# Patient Record
Sex: Female | Born: 2000 | Hispanic: Yes | Marital: Single | Smoking: Never smoker
Health system: Southern US, Community
[De-identification: ages and names within clinical notes are randomized; demographics above are authoritative.]

## PROBLEM LIST (undated history)

## (undated) ENCOUNTER — Ambulatory Visit: Admission: EM | Disposition: A | Discharge status: Home / Self Care | Payer: Self-pay

## (undated) DIAGNOSIS — Z789 Other specified health status: Secondary | ICD-10-CM

## (undated) HISTORY — PX: NO PAST SURGERIES: SHX2092

---
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
## 2021-01-24 DIAGNOSIS — R300 Dysuria: Secondary | ICD-10-CM | POA: Diagnosis not present

## 2021-01-24 DIAGNOSIS — R350 Frequency of micturition: Secondary | ICD-10-CM | POA: Diagnosis not present

## 2021-09-27 IMAGING — US US OB < 14 WEEKS - US OB TV
1 series · 15 of 28 positions shown · non-contrast
Comparison: None.

CLINICAL DATA: Abdominal cramping with vaginal bleeding

EXAM:
OBSTETRIC <14 WK US AND TRANSVAGINAL OB US
TECHNIQUE: Both transabdominal and transvaginal ultrasound examinations were
performed for complete evaluation of the gestation as well as the
maternal uterus, adnexal regions, and pelvic cul-de-sac.
Transvaginal technique was performed to assess early pregnancy.

[Series 1: us ob < 14 weeks - us ob tv · 15 of 45 slices shown]
[im 1/45]
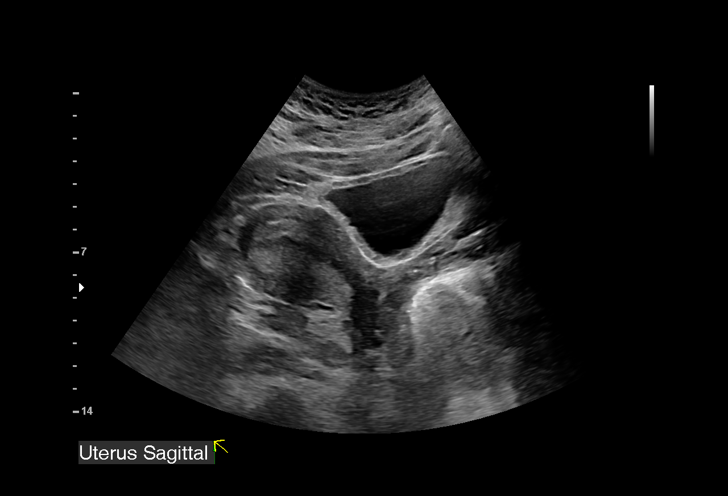
[im 4/45]
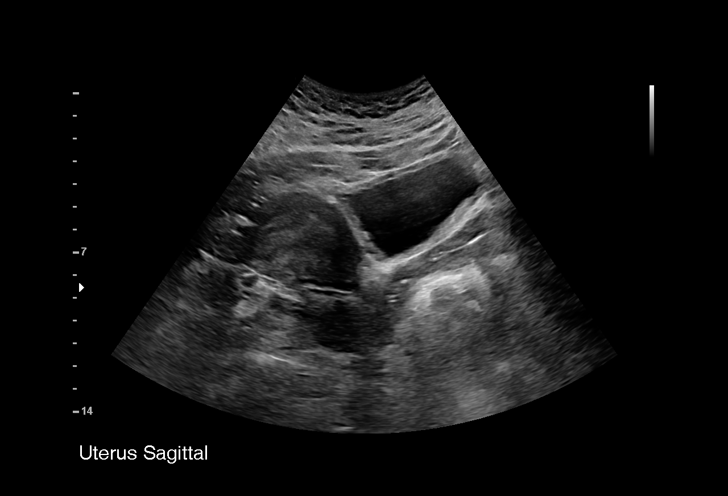
[im 7/45]
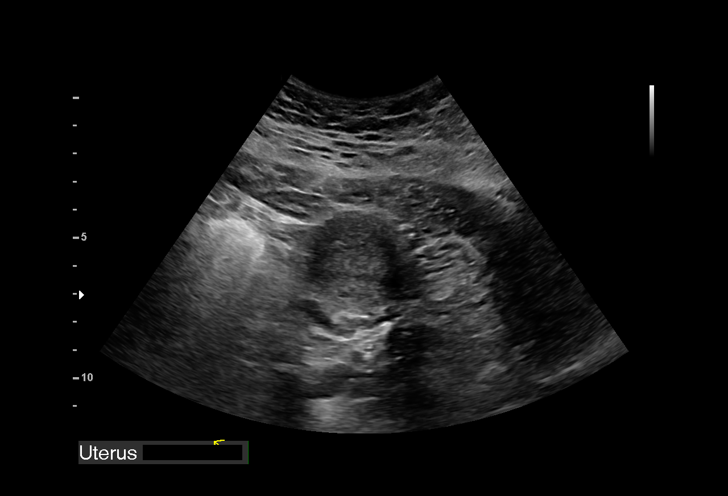
[im 10/45]
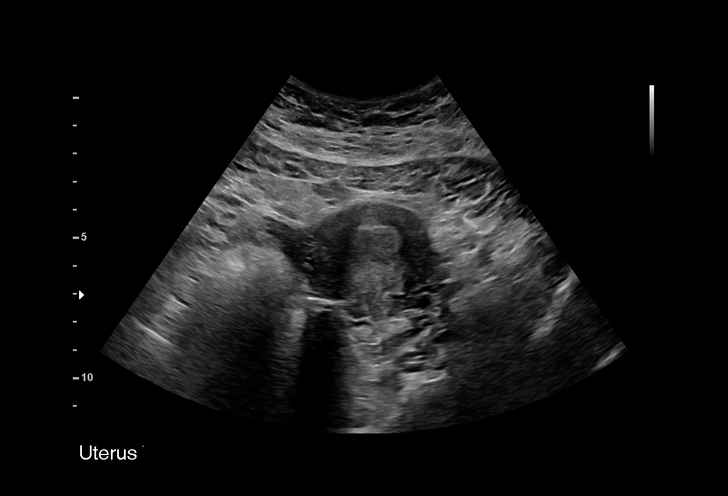
[im 14/45]
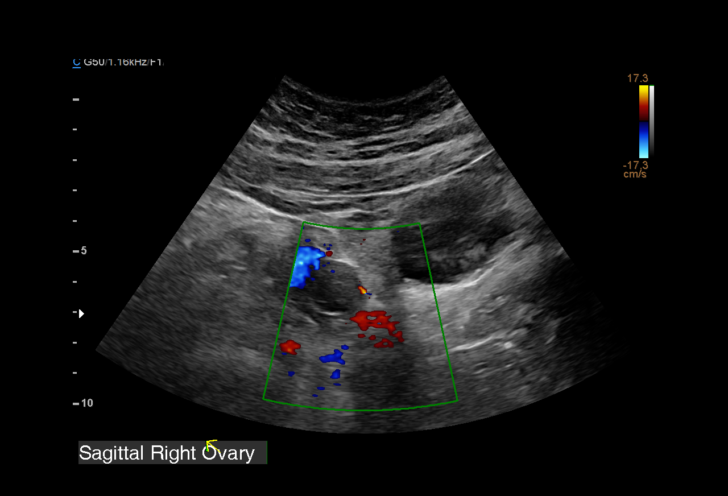
[im 17/45]
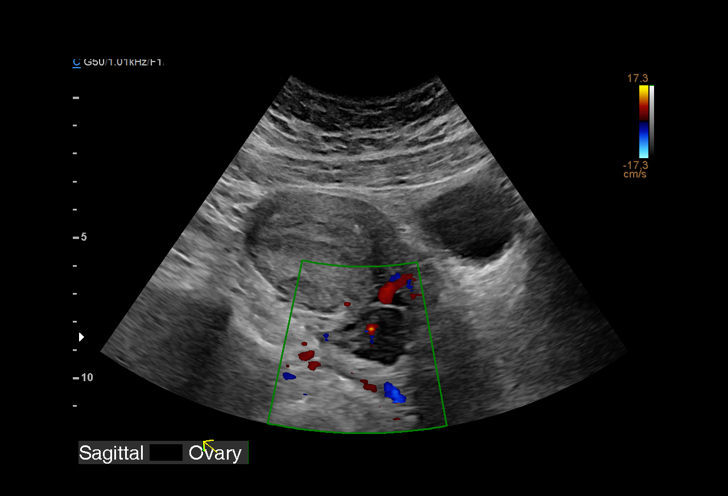
[im 20/45]
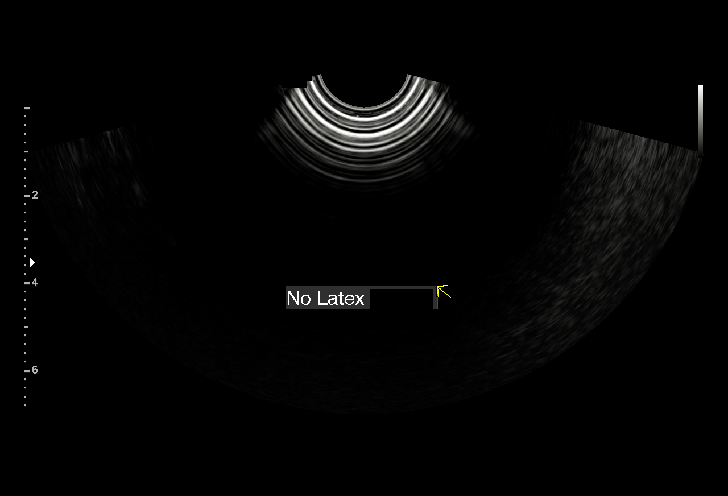
[im 23/45]
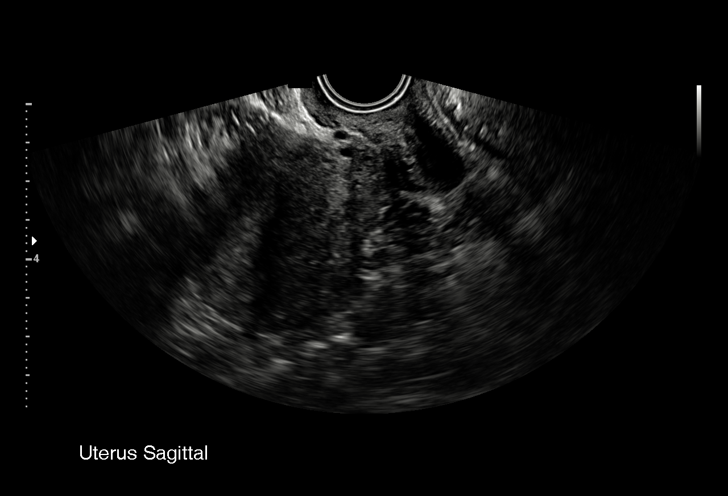
[im 25/45]
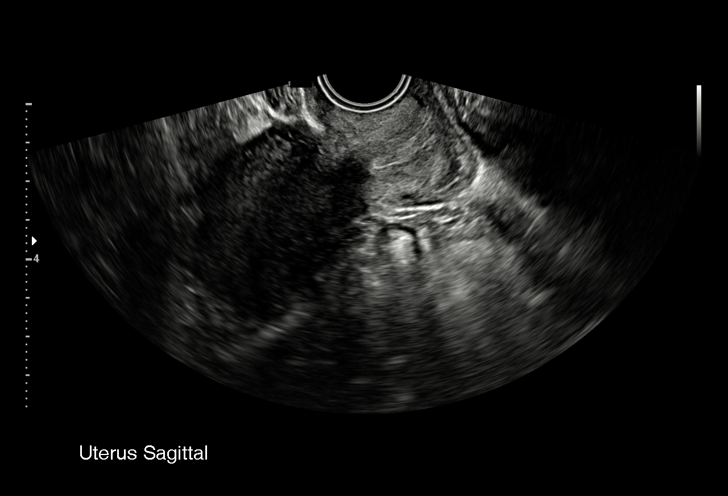
[im 28/45]
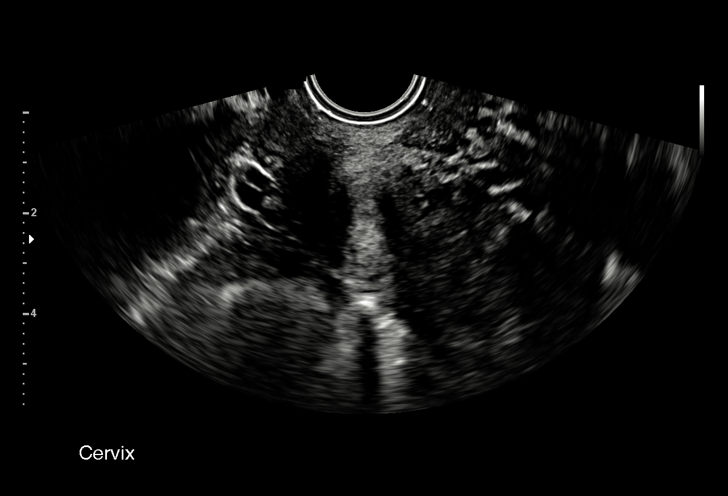
[im 31/45]
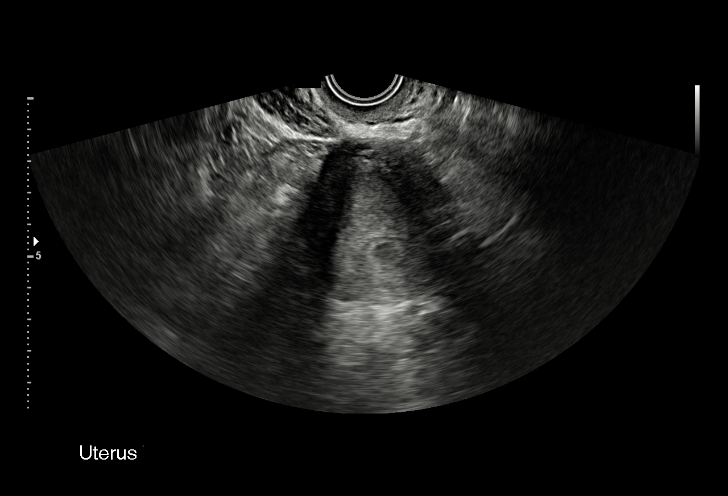
[im 35/45]
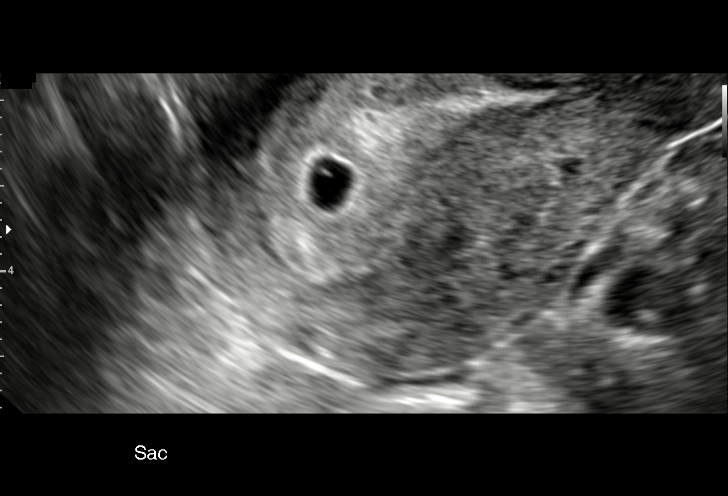
[im 38/45]
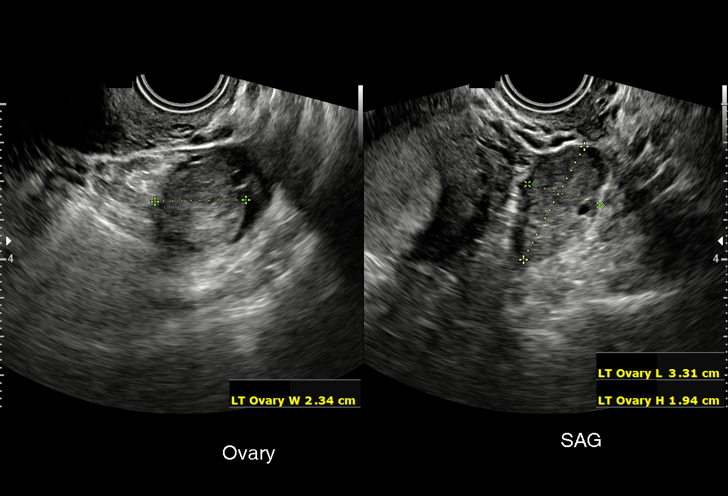
[im 41/45]
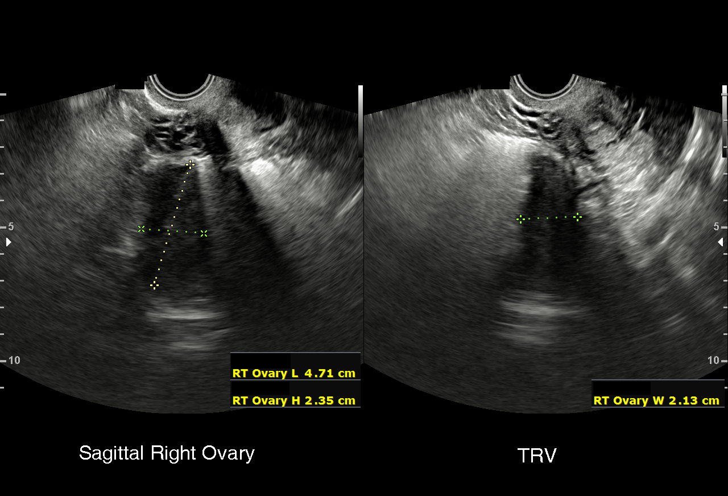
[im 45/45]
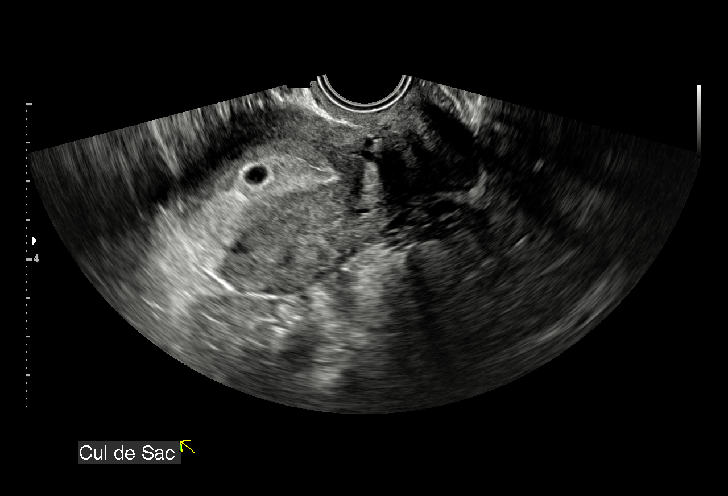

[15 of 28 positions shown; findings below may reference images not displayed]

FINDINGS: Intrauterine gestational sac: Visualized

Yolk sac: Visualized

Embryo:  Not visualized

Cardiac Activity: Not visualized

MSD: 6 mm   5 w   2 d

Subchorionic hemorrhage:  None visualized.

Maternal uterus/adnexae: Cervical os is closed. Right ovary measures
3.4 x 2.2 x 2.0 cm. Left ovary measures 3.2 x 2.4 x 2.0 cm. A slight
amount of fluid surrounds the left ovary. No other free fluid noted.
IMPRESSION: 1. There is an intrauterine gestational sac containing a yolk sac.
Fetal pole not seen currently. This finding warrants follow-up
ultrasound in 10-14 days to assess for fetal pole and fetal heart
activity. Based on gestational sac size, estimated gestational age
is 5+ weeks. No evidence of chorionic hemorrhage.

2. Small amount of fluid immediately adjacent to the left ovary.
Suspect recent corpus luteum rupture.

## 2021-11-11 IMAGING — US US OB TRANSVAGINAL
1 series · 15 of 28 positions shown · non-contrast
Comparison: 08/20/2019 obstetric scan.

Addendum:
:
Please select correct "US Early OB" or "US ED OB Limited" template
depending on combination of exams performed/being read (e.g. Both,
Doppler, etc.)

ADDENDUM:
CLINICAL DATA: 18-year-old pregnant female presents with vaginal
bleeding. Intrauterine gestation without embryonic cardiac activity
on scan performed 4 days prior.
EXAM:
TRANSVAGINAL OB ULTRASOUND
TECHNIQUE: Transvaginal ultrasound was performed for complete evaluation of the
gestation as well as the maternal uterus, adnexal regions, and
pelvic cul-de-sac.

[Series 1: us ob transvaginal · 28 acquisitions, 15 frames shown]
[im 1/28]
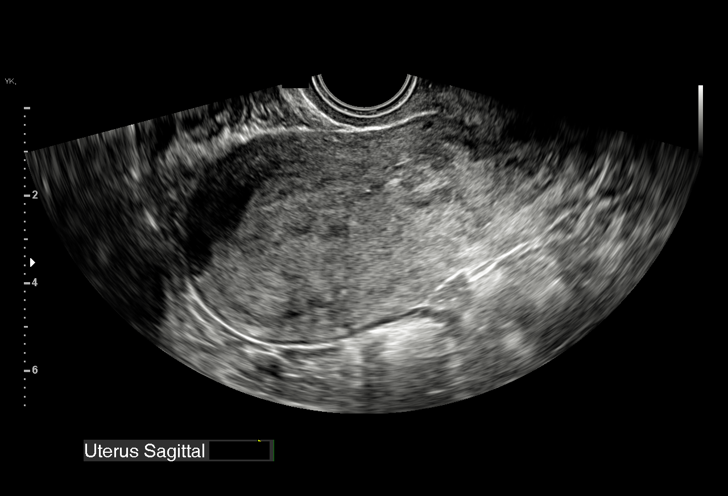
[im 3/28]
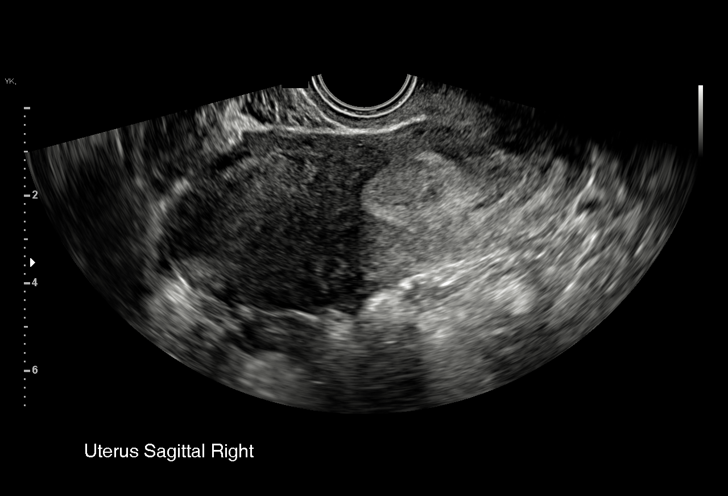
[im 5/28]
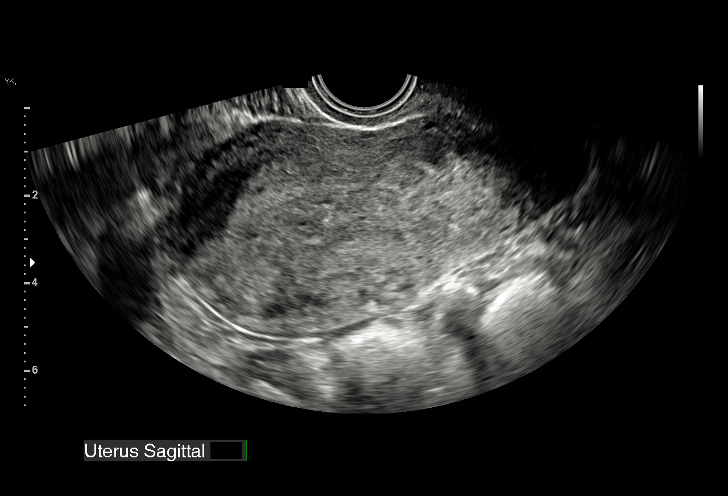
[im 7/28]
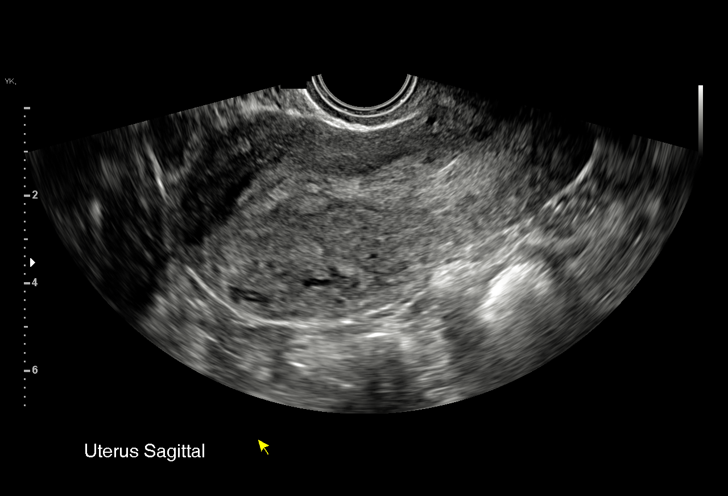
[im 9/28]
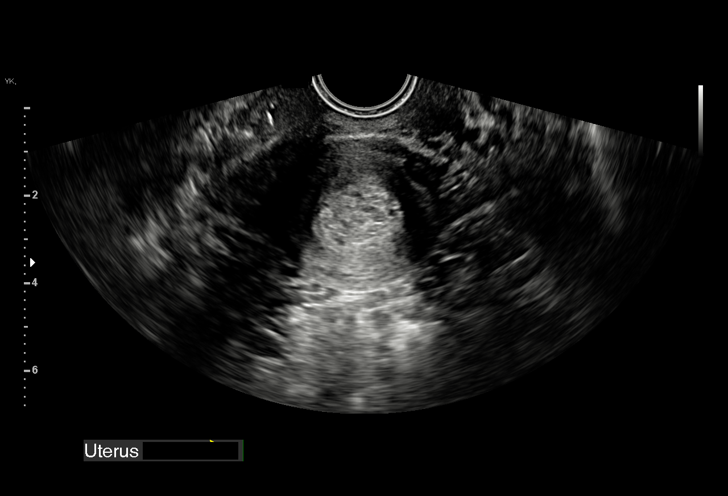
[im 11/28]
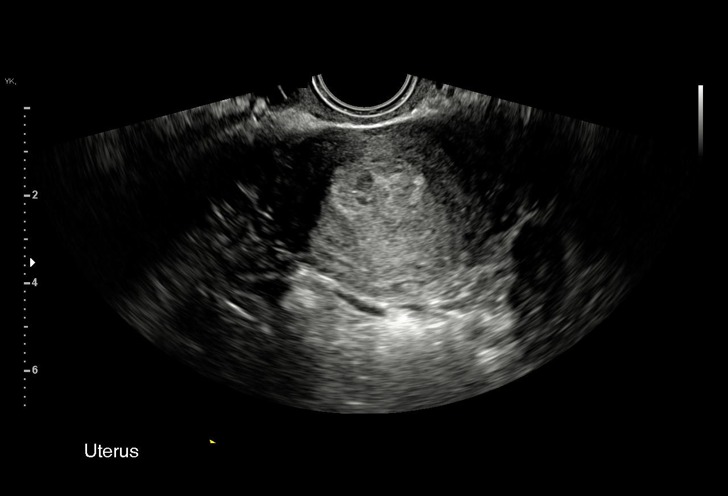
[im 13/28]
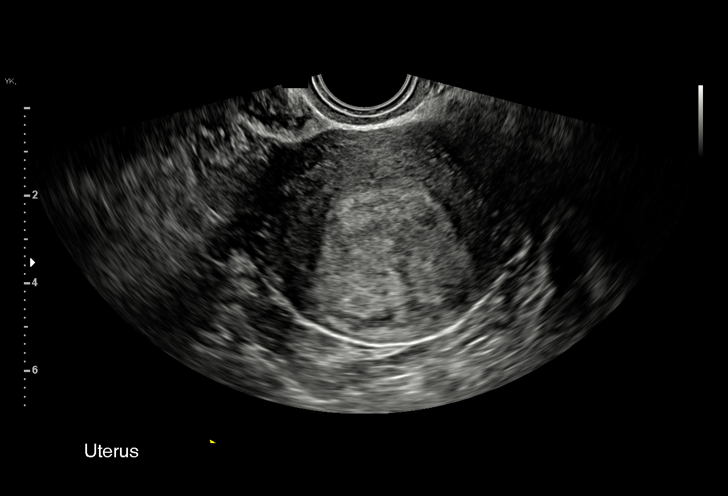
[im 15/28]
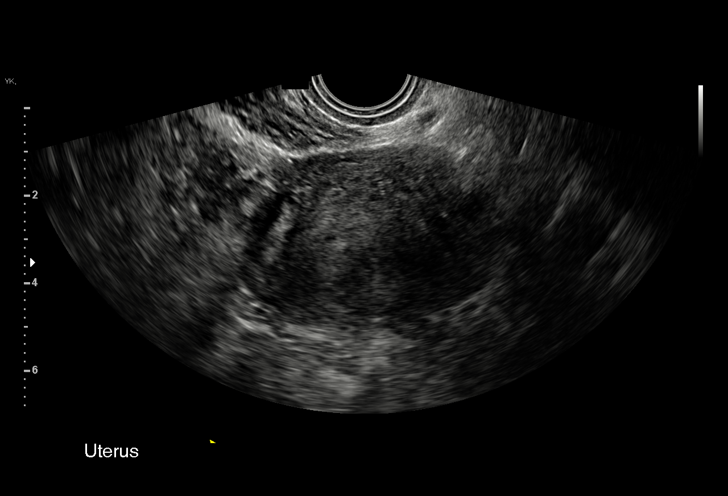
[im 16/28]
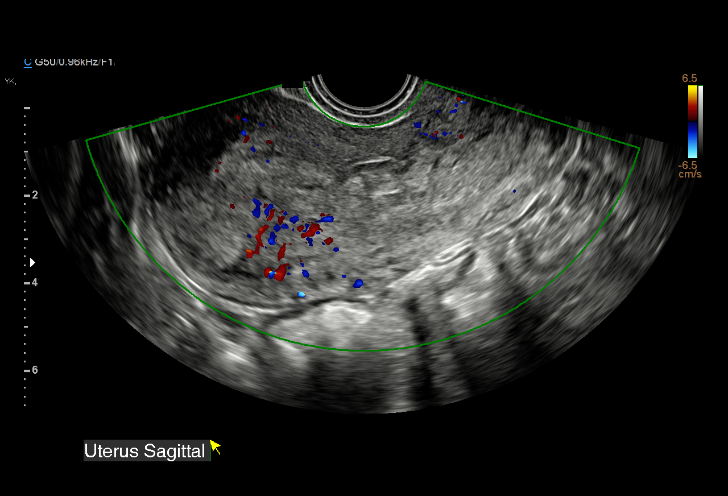
[im 18/28]
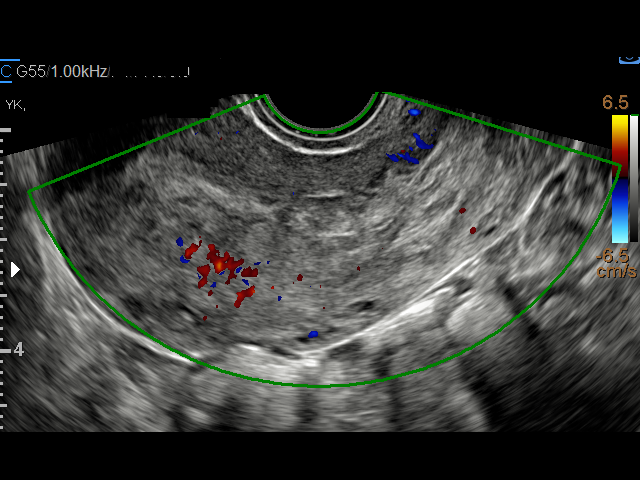
[im 20/28]
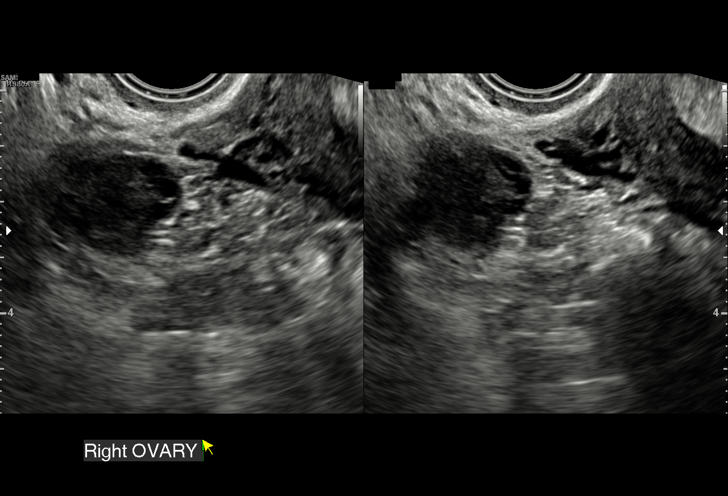
[im 22/28]
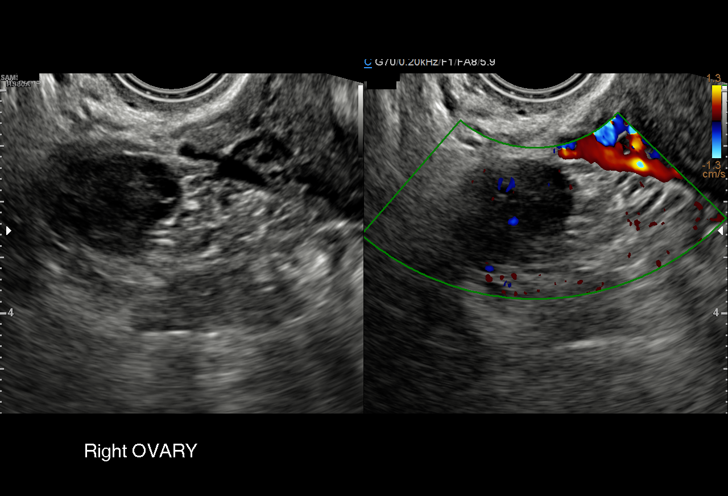
[im 24/28]
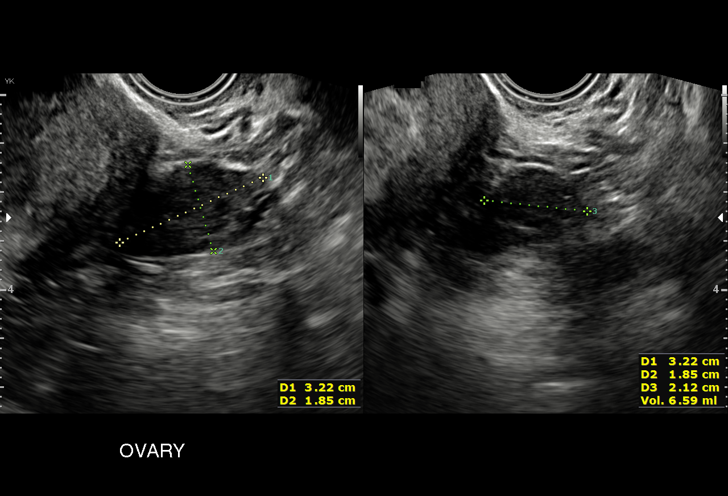
[im 26/28]
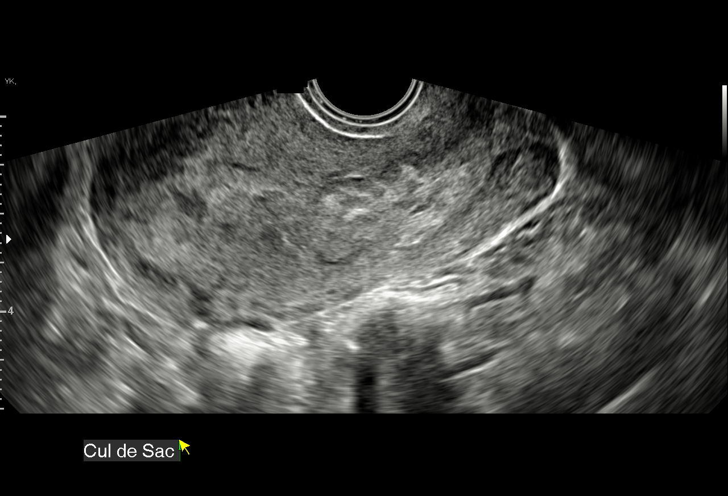
[im 28/28]
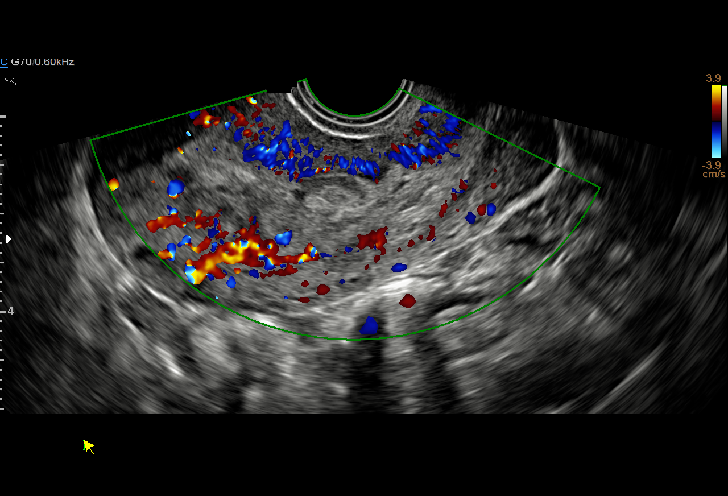

[15 of 28 positions shown; findings below may reference images not displayed]

FINDINGS: Intrauterine gestational sac: None. Previously visualized
intrauterine gestational sac on 08/20/2019 scan is absent on today's
scan.

Maternal uterus/adnexae: Anteverted uterus. No uterine fibroids.
Bilayer endometrial thickness approximately 15 mm. Heterogeneous
endometrium with no focal endometrial mass.

Right ovary measures 2.5 x 1.8 x 2.0 cm. Left ovary measures 3.2 x
1.9 x 2.1 cm. No suspicious ovarian or adnexal masses. No abnormal
free fluid in the pelvis.
IMPRESSION: Spontaneous abortion in progress, with interval passage of
intrauterine gestational sac. No ovarian or adnexal abnormality.

These addended results were called by telephone at the time of
who verbally acknowledged these results.

*** End of Addendum ***
:
Please select correct "US Early OB" or "US ED OB Limited" template
depending on combination of exams performed/being read (e.g. Both,
Doppler, etc.)

## 2024-01-10 ENCOUNTER — Ambulatory Visit
Admission: RE | Admit: 2024-01-10 | Discharge: 2024-01-10 | Disposition: A | Discharge status: Home / Self Care | Payer: Self-pay | Source: Ambulatory Visit | Attending: Family Medicine | Admitting: Family Medicine

## 2024-01-10 ENCOUNTER — Other Ambulatory Visit: Payer: Self-pay

## 2024-01-10 VITALS — BP 132/81 | HR 76 | Temp 98.3°F | Resp 16

## 2024-01-10 DIAGNOSIS — S80862A Insect bite (nonvenomous), left lower leg, initial encounter: Secondary | ICD-10-CM

## 2024-01-10 DIAGNOSIS — W57XXXA Bitten or stung by nonvenomous insect and other nonvenomous arthropods, initial encounter: Secondary | ICD-10-CM

## 2024-01-10 MED ORDER — DOXYCYCLINE HYCLATE 100 MG PO CAPS: 100.0000 mg | ORAL_CAPSULE | Freq: Two times a day (BID) | ORAL | 0 refills | Status: AC

## 2024-01-10 MED ORDER — TRIAMCINOLONE ACETONIDE 0.1 % EX CREA: 1.0000 | TOPICAL_CREAM | Freq: Two times a day (BID) | CUTANEOUS | 0 refills | Status: AC

## 2024-01-10 NOTE — ED Triage Notes (Signed)
 Pt states she rescued a cat and has a flee infestation in her house now. PT states she has flee bites on ankles, abdomen and left forearmx1wk. Pt c/o night sweats, chills, HA, neck soreness, felt offx4d.

## 2024-01-10 NOTE — Discharge Instructions (Addendum)
 Start triamcinolone  topically twice daily as needed for itching.  Start doxycycline  twice daily for 7 days as this will cover any potential flea borne illnesses.  Please have your cat evaluated ASAP by veterinarian.  Follow-up with your PCP in 2 to 3 days for recheck and go to the ER if you develop any worsening symptoms.  Hope you feel better soon!

## 2024-01-10 NOTE — ED Provider Notes (Addendum)
 [facility]    CSN: [id number] Arrival date & time: 01/10/24  9074      History   Chief Complaint Chief Complaint  Patient presents with   Insect Bite    Field bad - Entered by patient   Headache   Chills    HPI Angela Fletcher is a 23 y.o. female with no significant past medical history presents for fleabites.  Patient reports her boyfriend rescued a cat from his job and she states is about 76 weeks old.  They have had it for about a month and states the cat does have fleas and as they are treating the cat she states the fleas have jumped off and infested her carpet in her home.  Due to that she has multiple flea bites on her lower legs.  She says they are itchy but denies any drainage, swelling, fevers, rashes.  She does endorse some chills headaches and just fatigue/malaise.  No URI symptoms.  She is eating and drinking normally.  No history of MRSA.  She has not yet taken the cat to the vet but denies any cat bites or scratches.  Patient is concerned for flea borne illnesses such as typhus.  Patient denies pregnancy or breast-feeding.   Headache   Past Medical History:  Diagnosis Date   Medical history non-contributory     Patient Active Problem List   Diagnosis Date Noted   SAB (spontaneous abortion) 09/09/2019    Past Surgical History:  Procedure Laterality Date   NO PAST SURGERIES      OB History     Gravida  1   Para      Term      Preterm      AB      Living         SAB      IAB      Ectopic      Multiple      Live Births               Home Medications    Prior to Admission medications   Medication Sig Start Date End Date Taking? Authorizing Provider  doxycycline  (VIBRAMYCIN ) 100 MG capsule Take 1 capsule (100 mg total) by mouth 2 (two) times daily. 01/10/24  Yes Marques Ericson, Jodi R, NP  triamcinolone  cream (KENALOG ) 0.1 % Apply 1 Application topically 2 (two) times daily. 01/10/24  Yes Kaedin Hicklin, Jodi R, NP  phenazopyridine   (PYRIDIUM ) 200 MG tablet Take 1 tablet (200 mg total) by mouth 3 (three) times daily as needed for pain. 08/11/20   Christopher Savannah, PA-C    Family History Family History  Problem Relation Age of Onset   Asthma Mother    Healthy Father     Social History Social History   Tobacco Use   Smoking status: Never   Smokeless tobacco: Never  Vaping Use   Vaping status: Never Used  Substance Use Topics   Alcohol use: Not Currently   Drug use: Not Currently    Types: Marijuana    Comment: quit right before pregnancy      Allergies   Patient has no known allergies.   Review of Systems Review of Systems  Constitutional:  Positive for chills.       Malaise  Skin:        Fleabites  Neurological:  Positive for headaches.     Physical Exam Triage Vital Signs ED Triage Vitals  Encounter Vitals Group     BP  01/10/24 0935 132/81     Girls Systolic BP Percentile --      Girls Diastolic BP Percentile --      Boys Systolic BP Percentile --      Boys Diastolic BP Percentile --      Pulse Rate 01/10/24 0935 76     Resp 01/10/24 0935 16     Temp 01/10/24 0935 98.3 F (36.8 C)     Temp Source 01/10/24 0935 Oral     SpO2 01/10/24 0935 99 %     Weight --      Height --      Head Circumference --      Peak Flow --      Pain Score 01/10/24 0934 6     Pain Loc --      Pain Education --      Exclude from Growth Chart --    No data found.  Updated Vital Signs BP 132/81   Pulse 76   Temp 98.3 F (36.8 C) (Oral)   Resp 16   LMP 12/29/2023   SpO2 99%   Visual Acuity Right Eye Distance:   Left Eye Distance:   Bilateral Distance:    Right Eye Near:   Left Eye Near:    Bilateral Near:     Physical Exam Vitals and nursing note reviewed.  Constitutional:      General: She is not in acute distress.    Appearance: Normal appearance. She is not ill-appearing.  HENT:     Head: Normocephalic and atraumatic.  Eyes:     Pupils: Pupils are equal, round, and reactive to light.   Cardiovascular:     Rate and Rhythm: Normal rate and regular rhythm.     Heart sounds: Normal heart sounds.  Pulmonary:     Effort: Pulmonary effort is normal.     Breath sounds: Normal breath sounds.  Skin:    General: Skin is warm and dry.     Comments: Several raised erythematous papules on lower legs.  Some scabbed regions from extensive scratching.  No swelling drainage or warmth.  See photo.  Neurological:     General: No focal deficit present.     Mental Status: She is alert and oriented to person, place, and time.  Psychiatric:        Mood and Affect: Mood normal.        Behavior: Behavior normal.         UC Treatments / Results  Labs (all labs ordered are listed, but only abnormal results are displayed) Labs Reviewed - No data to display  EKG   Radiology No results found.  Procedures Procedures (including critical care time)  Medications Ordered in UC Medications - No data to display  Initial Impression / Assessment and Plan / UC Course  I have reviewed the triage vital signs and the nursing notes.  Pertinent labs & imaging results that were available during my care of the patient were reviewed by me and considered in my medical decision making (see chart for details).     I reviewed exam and symptoms with patient.  No red flags.  Patient presenting with multiple flea bites to lower legs after rescued a stray cat.  Cat has not yet been to the vet.  Patient states she has had the cat for 2 months and is show no signs or behavioral changes.  Patient also denies any scratches or bites.  Will do topical triamcinolone  for symptom relief.  I think is reasonable to start doxycycline  to treat any potential flea borne illnesses as the cat has not yet been evaluated.  I did advise the patient to follow-up with her PCP in 2 to 3 days for recheck and strict ER precautions reviewed and patient verbalized understanding. Final Clinical Impressions(s) / UC Diagnoses    Final diagnoses:  Flea bite of multiple sites     Discharge Instructions      Start triamcinolone  topically twice daily as needed for itching.  Start doxycycline  twice daily for 7 days as this will cover any potential flea borne illnesses.  Please have your cat evaluated ASAP by veterinarian.  Follow-up with your PCP in 2 to 3 days for recheck and go to the ER if you develop any worsening symptoms.  Hope you feel better soon!     ED Prescriptions     Medication Sig Dispense Auth. Provider   triamcinolone  cream (KENALOG ) 0.1 % Apply 1 Application topically 2 (two) times daily. 30 g Levaeh Vice, Jodi R, NP   doxycycline  (VIBRAMYCIN ) 100 MG capsule Take 1 capsule (100 mg total) by mouth 2 (two) times daily. 20 capsule Ido Wollman, Jodi R, NP      PDMP not reviewed this encounter.   Loreda Myla SAUNDERS, NP 01/10/24 1017    Loreda Myla SAUNDERS, NP 01/10/24 1018

## 2024-03-13 ENCOUNTER — Other Ambulatory Visit: Payer: Self-pay | Admitting: Medical Genetics
